# Patient Record
Sex: Male | Born: 1980 | Race: Black or African American | Hispanic: No | Marital: Single | Smoking: Former smoker
Health system: Southern US, Community
[De-identification: ages and names within clinical notes are randomized; demographics above are authoritative.]

## PROBLEM LIST (undated history)

## (undated) HISTORY — PX: WISDOM TOOTH EXTRACTION: SHX21

---

## 2003-02-28 ENCOUNTER — Emergency Department (HOSPITAL_COMMUNITY): Admission: EM | Admit: 2003-02-28 | Discharge: 2003-03-01 | Payer: Self-pay | Admitting: Emergency Medicine

## 2015-06-17 ENCOUNTER — Ambulatory Visit: Payer: Self-pay | Admitting: Urgent Care

## 2015-07-01 ENCOUNTER — Encounter: Payer: Self-pay | Admitting: Urgent Care

## 2015-07-01 ENCOUNTER — Ambulatory Visit (INDEPENDENT_AMBULATORY_CARE_PROVIDER_SITE_OTHER): Payer: 59 | Admitting: Urgent Care

## 2015-07-01 VITALS — BP 125/78 | HR 82 | Temp 98.1°F | Resp 16 | Ht 71.5 in | Wt 186.0 lb

## 2015-07-01 DIAGNOSIS — J302 Other seasonal allergic rhinitis: Secondary | ICD-10-CM

## 2015-07-01 DIAGNOSIS — M2660 Temporomandibular joint disorder, unspecified: Secondary | ICD-10-CM | POA: Diagnosis not present

## 2015-07-01 DIAGNOSIS — M26609 Unspecified temporomandibular joint disorder, unspecified side: Secondary | ICD-10-CM | POA: Insufficient documentation

## 2015-07-01 MED ORDER — FLUTICASONE PROPIONATE 50 MCG/ACT NA SUSP: 2.0000 | Freq: Every day | NASAL | Status: AC

## 2015-07-01 MED ORDER — NAPROXEN SODIUM 550 MG PO TABS: 550.0000 mg | ORAL_TABLET | Freq: Two times a day (BID) | ORAL | Status: AC

## 2015-07-01 MED ORDER — LEVOCETIRIZINE DIHYDROCHLORIDE 5 MG PO TABS: 5.0000 mg | ORAL_TABLET | Freq: Every evening | ORAL | Status: DC

## 2015-07-01 NOTE — Progress Notes (Signed)
    MRN: [id number] DOB: Nov 06, 1981  Subjective:   Caleb Herrera is a 34 y.o. male presenting for chief complaint of Jaw Pain and Allergies  Jaw pain - reports several year history of intermittent right sided jaw pain. Recalls when he was ~19, got hit over his mid-right jaw while horseplaying with a friend. States that he has had difficulty with intermittent jaw pain since then. Reports clicking, popping, difficulty opening his mouth wide at times, cannot always eat burgers d/t difficulty opening mouth wide. Has never tried medications for this. Denies masses, fever, weight loss, numbness and tingling, grinding his teeth, bony deformity.   Allergies - reports that he has had worsening congestion, runny nose, scratchy throat due to developing seasonal allergies since his early 20's. He recently started Zyrtec which has helped some. He has a dog at home, sheds a lot of hair and sleeps on his bed. Denies ear pain, ear drainage, tooth pain, throat pain, shob, wheezing, chest pain. Denies any other aggravating or relieving factors, no other questions or concerns.  Caleb Herrera has a current medication list which includes the following prescription(s): cetirizine. He has No Known Allergies.  Caleb Herrera  has no past medical history on file. Also  has past surgical history that includes Wisdom tooth extraction.  ROS As in subjective.  Objective:   Vitals: BP 125/78 mmHg  Pulse 82  Temp(Src) 98.1 F (36.7 C)  Resp 16  Ht 5' 11.5" (1.816 m)  Wt 186 lb (84.369 kg)  BMI 25.58 kg/m2  Physical Exam  Constitutional: He is oriented to person, place, and time. He appears well-developed and well-nourished.  HENT:  Head:    Patient had difficulty opening jaw past 50-60 degrees with audible popping heard and click felt on right side.  Cardiovascular: Normal rate, regular rhythm and intact distal pulses.  Exam reveals no gallop and no friction rub.   No murmur heard. Pulmonary/Chest: No respiratory  distress. He has no wheezes. He has no rales.  Neurological: He is alert and oriented to person, place, and time.  Skin: Skin is warm and dry. No rash noted. No erythema. No pallor.    Assessment and Plan :   1. 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 Take anaprox, wear mouthguard at night. - If no improvement, consider referral to PT or ortho for further evaluation, consider CT or if any abnormalities show up with tissue then biopsy - Recheck in 2 weeks.  2. Seasonal allergies - Avoid allergens as much as possible including no pets in bedroom. Start allergy meds. Recheck in 2 weeks. - levocetirizine (XYZAL) 5 MG tablet; Take 1 tablet (5 mg total) by mouth every evening.  Dispense: 30 tablet; Refill: 11 - fluticasone (FLONASE) 50 MCG/ACT nasal spray; Place 2 sprays into both nostrils daily.  Dispense: 16 g; Refill: 11   Wallis Bamberg, PA-C [facility] [phone] 07/01/2015 3:52 PM

## 2015-07-01 NOTE — Patient Instructions (Signed)
Temporomandibular Problems  Temporomandibular joint (TMJ) dysfunction means there are problems with the joint between your jaw and your skull. This is a joint lined by cartilage like other joints in your body but also has a small disc in the joint which keeps the bones from rubbing on each other. These joints are like other joints and can get inflamed (sore) from arthritis and other problems. When this joint gets sore, it can cause headaches and pain in the jaw and the face. CAUSES  Usually the arthritic types of problems are caused by soreness in the joint. Soreness in the joint can also be caused by overuse. This may come from grinding your teeth. It may also come from mis-alignment in the joint. DIAGNOSIS Diagnosis of this condition can often be made by history and exam. Sometimes your caregiver may need X-rays or an MRI scan to determine the exact cause. It may be necessary to see your dentist to determine if your teeth and jaws are lined up correctly. TREATMENT  Most of the time this problem is not serious; however, sometimes it can persist (become chronic). When this happens medications that will cut down on inflammation (soreness) help. Sometimes a shot of cortisone into the joint will be helpful. If your teeth are not aligned it may help for your dentist to make a splint for your mouth that can help this problem. If no physical problems can be found, the problem may come from tension. If tension is found to be the cause, biofeedback or relaxation techniques may be helpful. HOME CARE INSTRUCTIONS   Later in the day, applications of ice packs may be helpful. Ice can be used in a plastic bag with a towel around it to prevent frostbite to skin. This may be used about every 2 hours for 20 to 30 minutes, as needed while awake, or as directed by your caregiver.  Only take over-the-counter or prescription medicines for pain, discomfort, or fever as directed by your caregiver.  If physical therapy was  prescribed, follow your caregiver's directions.  Wear mouth appliances as directed if they were given. Document Released: 08/08/2001 Document Revised: 02/05/2012 Document Reviewed: 11/15/2008 ExitCare Patient Information 2015 [employer]. This information is not intended to replace advice given to you by your health care provider. Make sure you discuss any questions you have with your health care provider.  

## 2015-07-15 ENCOUNTER — Encounter: Payer: Self-pay | Admitting: Urgent Care

## 2015-07-15 ENCOUNTER — Ambulatory Visit (INDEPENDENT_AMBULATORY_CARE_PROVIDER_SITE_OTHER): Payer: 59

## 2015-07-15 ENCOUNTER — Ambulatory Visit (INDEPENDENT_AMBULATORY_CARE_PROVIDER_SITE_OTHER): Payer: 59 | Admitting: Urgent Care

## 2015-07-15 VITALS — BP 126/90 | HR 77 | Temp 98.3°F | Resp 16 | Ht 70.5 in | Wt 186.0 lb

## 2015-07-15 DIAGNOSIS — M26609 Unspecified temporomandibular joint disorder, unspecified side: Secondary | ICD-10-CM

## 2015-07-15 DIAGNOSIS — M2660 Temporomandibular joint disorder, unspecified: Secondary | ICD-10-CM

## 2015-07-15 DIAGNOSIS — N6310 Unspecified lump in the right breast, unspecified quadrant: Secondary | ICD-10-CM

## 2015-07-15 DIAGNOSIS — N63 Unspecified lump in breast: Secondary | ICD-10-CM

## 2015-07-15 DIAGNOSIS — R29898 Other symptoms and signs involving the musculoskeletal system: Secondary | ICD-10-CM

## 2015-07-15 NOTE — Progress Notes (Signed)
    MRN: [id number] DOB: 1981-07-01  Subjective:   Caleb Herrera is a 34 y.o. male presenting for follow up on jaw pain and breast mass.   Jaw - he was last seen here by PA-Judah Carchi on 07/01/2015 with working diagnosis of TMJ, started on Anaprox twice daily and advised to wear mouthguard. Patient reports that he has used Anaprox, mouthguard as instructed. He reports significant improvement in pain and ability to open jaw. However, he still has clicking of his jaw. Denies fever, gum pain, numbness or tingling, swelling, tooth pain.   Breast mass - reports that his right breast has been enlarging since his teens. He states that he was told that this was not abnormal and to follow up as needed. Today, he reports a 3 year history of intermittent pain over his right breast elicited with deep pressure, hugs. He also admits significant discomfort with the asymmetry of his chest. ROS as above as well as no nipple discharge or inversion, lactation, bleeding, skin changes, weight loss, night sweats, mood disturbances, acne. Admits family history of cancer in his grandfather but cannot recall what type, currently in remission. Denies any other aggravating or relieving factors, no other questions or concerns.  Maddax has a current medication list which includes the following prescription(s): levocetirizine, naproxen sodium, and fluticasone. Also has No Known Allergies.  Delante  has no past medical history on file. Also  has past surgical history that includes Wisdom tooth extraction.  Objective:   Vitals: BP 126/90 mmHg  Pulse 77  Temp(Src) 98.3 F (36.8 C) (Oral)  Resp 16  Ht 5' 10.5" (1.791 m)  Wt 186 lb (84.369 kg)  BMI 26.30 kg/m2  SpO2 98%  Physical Exam  Constitutional: He is oriented to person, place, and time. He appears well-developed and well-nourished.  HENT:  Mouth/Throat: Mucous membranes are not pale, not dry and not cyanotic. He does not have dentures. No oral lesions. No trismus in  the jaw. Normal dentition. No dental abscesses, uvula swelling, lacerations or dental caries. No oropharyngeal exudate.  TMJ non-tender with a slight click.  Cardiovascular: Normal rate.   Pulmonary/Chest: Effort normal. Right breast exhibits mass (right breast significantly larger than left primarily over area outlined). Right breast exhibits no inverted nipple, no nipple discharge, no skin change and no tenderness. Left breast exhibits no inverted nipple, no mass, no nipple discharge, no skin change and no tenderness. Breasts are asymmetrical.    Neurological: He is alert and oriented to person, place, and time.  Skin: Skin is warm and dry. No rash noted. No erythema. No pallor.  Psychiatric: He has a normal mood and affect.   [facility] reading (PRIMARY) by  Dr. Merla Riches and PA-Creed Kail. Mandible - negative.  Assessment and Plan :   1. TMJ (temporomandibular joint syndrome) 2. Jaw clicking - Physical exam findings reassuring, x-ray reassuring - Recommended patient continue with Anaprox and mouthguard given improvement with these measures - Ambulatory referral to Orthopedic Surgery to further evaluate clicking and options for management/possible injection  3. Breast mass, right - Gynecomastia versus mass, will further evaluate with labs, [place] of right breast - F/u s/p [place] - US BREAST COMPLETE UNI RIGHT INC AXILLA; Future - Testosterone - Prolactin - Comprehensive metabolic panel - TSH  Wallis Bamberg, PA-C [facility] [phone] 07/15/2015 2:50 PM

## 2015-07-15 NOTE — Patient Instructions (Signed)
Temporomandibular Problems  Temporomandibular joint (TMJ) dysfunction means there are problems with the joint between your jaw and your skull. This is a joint lined by cartilage like other joints in your body but also has a small disc in the joint which keeps the bones from rubbing on each other. These joints are like other joints and can get inflamed (sore) from arthritis and other problems. When this joint gets sore, it can cause headaches and pain in the jaw and the face. CAUSES  Usually the arthritic types of problems are caused by soreness in the joint. Soreness in the joint can also be caused by overuse. This may come from grinding your teeth. It may also come from mis-alignment in the joint. DIAGNOSIS Diagnosis of this condition can often be made by history and exam. Sometimes your caregiver may need X-rays or an MRI scan to determine the exact cause. It may be necessary to see your dentist to determine if your teeth and jaws are lined up correctly. TREATMENT  Most of the time this problem is not serious; however, sometimes it can persist (become chronic). When this happens medications that will cut down on inflammation (soreness) help. Sometimes a shot of cortisone into the joint will be helpful. If your teeth are not aligned it may help for your dentist to make a splint for your mouth that can help this problem. If no physical problems can be found, the problem may come from tension. If tension is found to be the cause, biofeedback or relaxation techniques may be helpful. HOME CARE INSTRUCTIONS   Later in the day, applications of ice packs may be helpful. Ice can be used in a plastic bag with a towel around it to prevent frostbite to skin. This may be used about every 2 hours for 20 to 30 minutes, as needed while awake, or as directed by your caregiver.  Only take over-the-counter or prescription medicines for pain, discomfort, or fever as directed by your caregiver.  If physical therapy was  prescribed, follow your caregiver's directions.  Wear mouth appliances as directed if they were given. Document Released: 08/08/2001 Document Revised: 02/05/2012 Document Reviewed: 11/15/2008 ExitCare Patient Information 2015 [employer]. This information is not intended to replace advice given to you by your health care provider. Make sure you discuss any questions you have with your health care provider.  

## 2015-07-16 LAB — COMPREHENSIVE METABOLIC PANEL
ALBUMIN: 4.8 g/dL (ref 3.6–5.1)
ALT: 19 U/L (ref 9–46)
AST: 16 U/L (ref 10–40)
Alkaline Phosphatase: 36 U/L — ABNORMAL LOW (ref 40–115)
BUN: 18 mg/dL (ref 7–25)
CALCIUM: 10.2 mg/dL (ref 8.6–10.3)
CO2: 29 mmol/L (ref 20–31)
Chloride: 102 mmol/L (ref 98–110)
Creat: 0.79 mg/dL (ref 0.60–1.35)
Glucose, Bld: 86 mg/dL (ref 65–99)
Potassium: 5 mmol/L (ref 3.5–5.3)
Sodium: 140 mmol/L (ref 135–146)
TOTAL PROTEIN: 7.2 g/dL (ref 6.1–8.1)
Total Bilirubin: 0.4 mg/dL (ref 0.2–1.2)

## 2015-07-16 LAB — PROLACTIN: Prolactin: 7.4 ng/mL (ref 2.1–17.1)

## 2015-07-16 LAB — TESTOSTERONE: TESTOSTERONE: 506 ng/dL (ref 300–890)

## 2015-07-16 LAB — TSH: TSH: 2.187 u[IU]/mL (ref 0.350–4.500)

## 2015-07-18 ENCOUNTER — Encounter: Payer: Self-pay | Admitting: Family Medicine

## 2015-07-22 ENCOUNTER — Telehealth: Payer: Self-pay | Admitting: Urgent Care

## 2015-07-22 NOTE — Telephone Encounter (Signed)
Called patient to see if he wanted to pursue referral to oral surgeon. Ortho was contacted and believe that he would be better managed for TMJ with an oral surgeon. However, from his previous visit, patient did not want to be too aggressive with his TMJ. Please let me know how he'd like to proceed. Also, please let me know if his U/S has been scheduled when patient calls back.  Thank you!

## 2015-07-26 ENCOUNTER — Other Ambulatory Visit: Payer: Self-pay | Admitting: Urgent Care

## 2015-07-26 DIAGNOSIS — N6310 Unspecified lump in the right breast, unspecified quadrant: Secondary | ICD-10-CM

## 2015-07-26 NOTE — Telephone Encounter (Signed)
Left a detailed message letting pt know to call back if he wants referral.

## 2015-07-26 NOTE — Telephone Encounter (Signed)
Reviewed results. Patient declined referral to oral surgery. He is awaiting appt for mammogram and [place] chest.

## 2015-07-28 ENCOUNTER — Telehealth: Payer: Self-pay

## 2015-07-28 DIAGNOSIS — N6310 Unspecified lump in the right breast, unspecified quadrant: Secondary | ICD-10-CM

## 2015-07-28 NOTE — Telephone Encounter (Signed)
[facility] requesting the location of the mass on patients right breast. Please add it to the order for [facility] Digital Diagnostic Bilateral. Once the location is added to the current order [facility] imaging will contact patient to schedule.

## 2015-07-29 NOTE — Telephone Encounter (Signed)
Information added. Thank you Crystal!

## 2015-08-02 ENCOUNTER — Other Ambulatory Visit: Payer: Self-pay

## 2015-08-10 ENCOUNTER — Ambulatory Visit
Admission: RE | Admit: 2015-08-10 | Discharge: 2015-08-10 | Disposition: A | Discharge status: Home / Self Care | Payer: 59 | Source: Ambulatory Visit | Attending: Urgent Care | Admitting: Urgent Care

## 2015-08-10 DIAGNOSIS — N6310 Unspecified lump in the right breast, unspecified quadrant: Secondary | ICD-10-CM

## 2015-08-31 ENCOUNTER — Other Ambulatory Visit: Payer: Self-pay | Admitting: Surgery

## 2016-01-05 ENCOUNTER — Other Ambulatory Visit: Payer: Self-pay | Admitting: Urgent Care

## 2016-01-05 MED FILL — LEVOCETIRIZINE 5 MG TABLET: 5 | 30 days supply | Qty: 30 | Fill #3

## 2016-06-09 MED FILL — LEVOCETIRIZINE 5 MG TABLET: 5 | 30 days supply | Qty: 30 | Fill #4

## 2016-07-12 IMAGING — CR DG MANDIBLE 4+V
4 series · 4 of 4 positions shown · non-contrast
Comparison: None in PACs

CLINICAL DATA: Right temporomandibular joint clicking

EXAM:
MANDIBLE - 4+ VIEW

[oblique (1 of 2)]
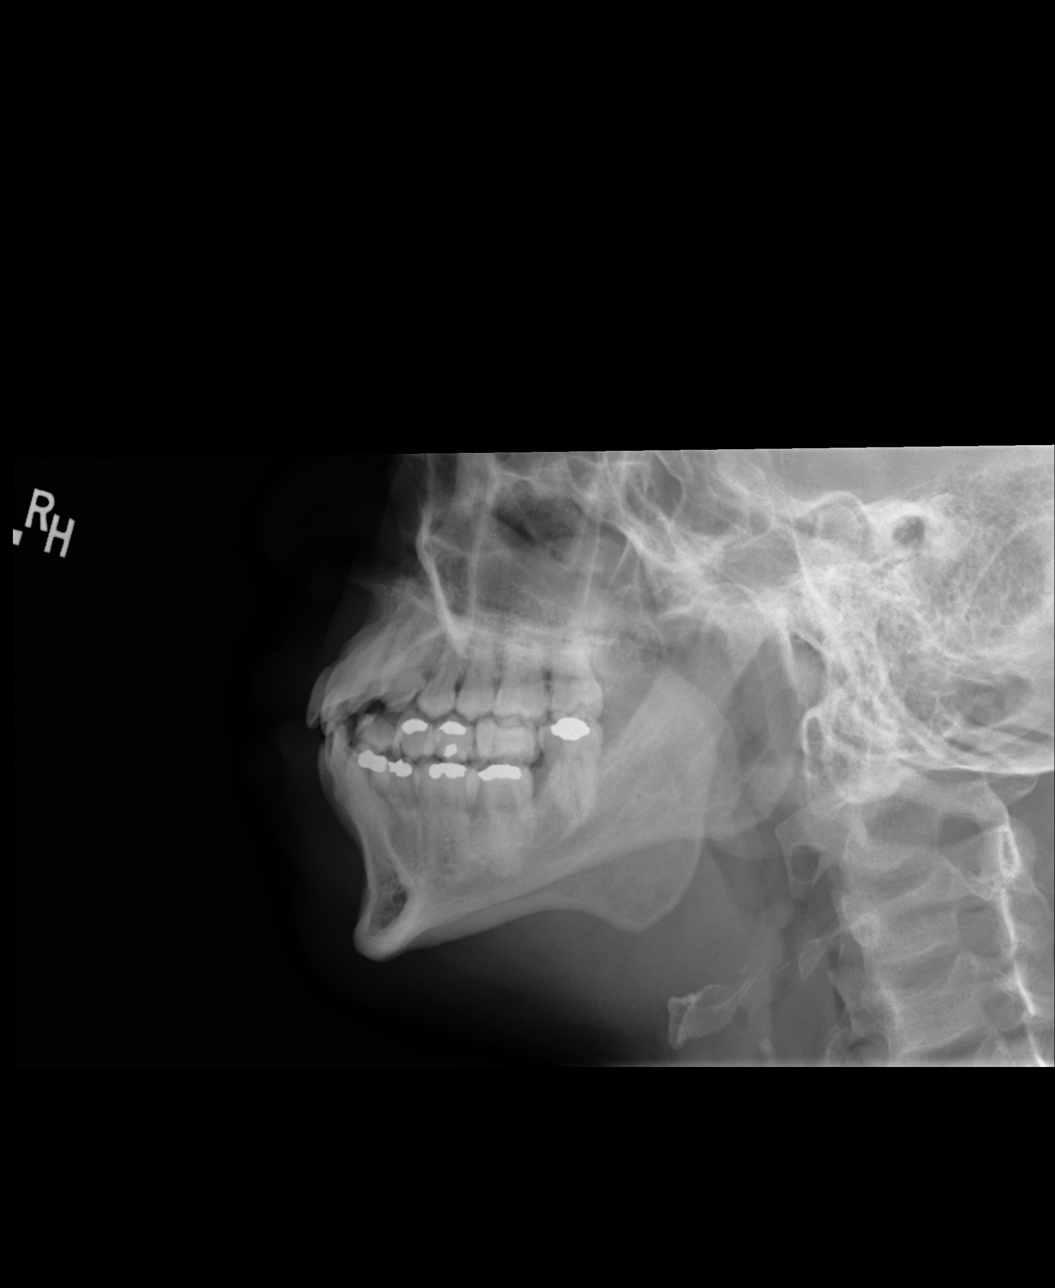

[PA]
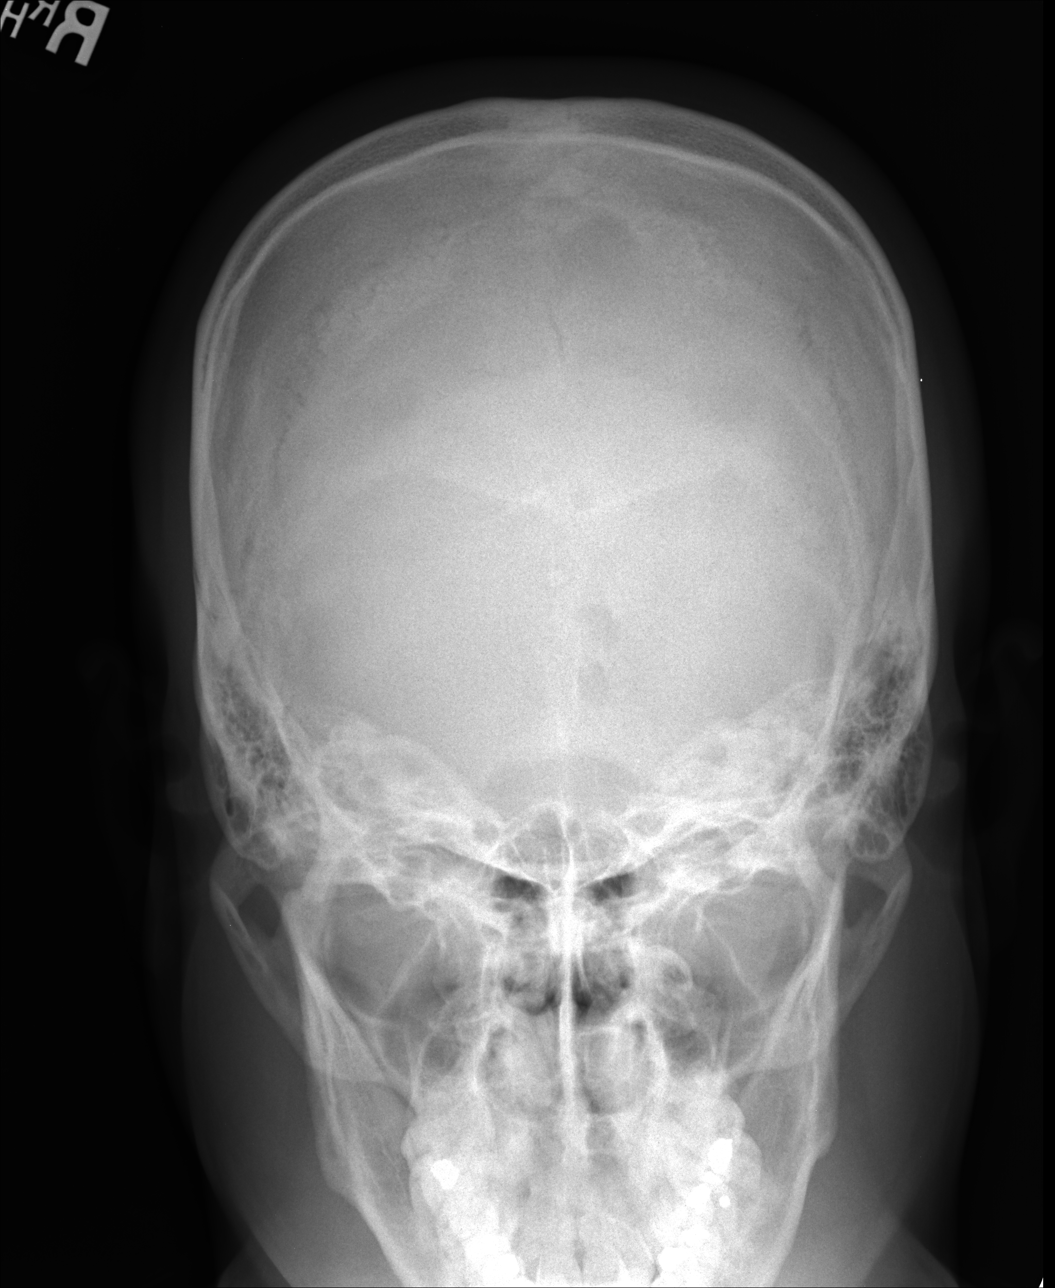

[lateral]
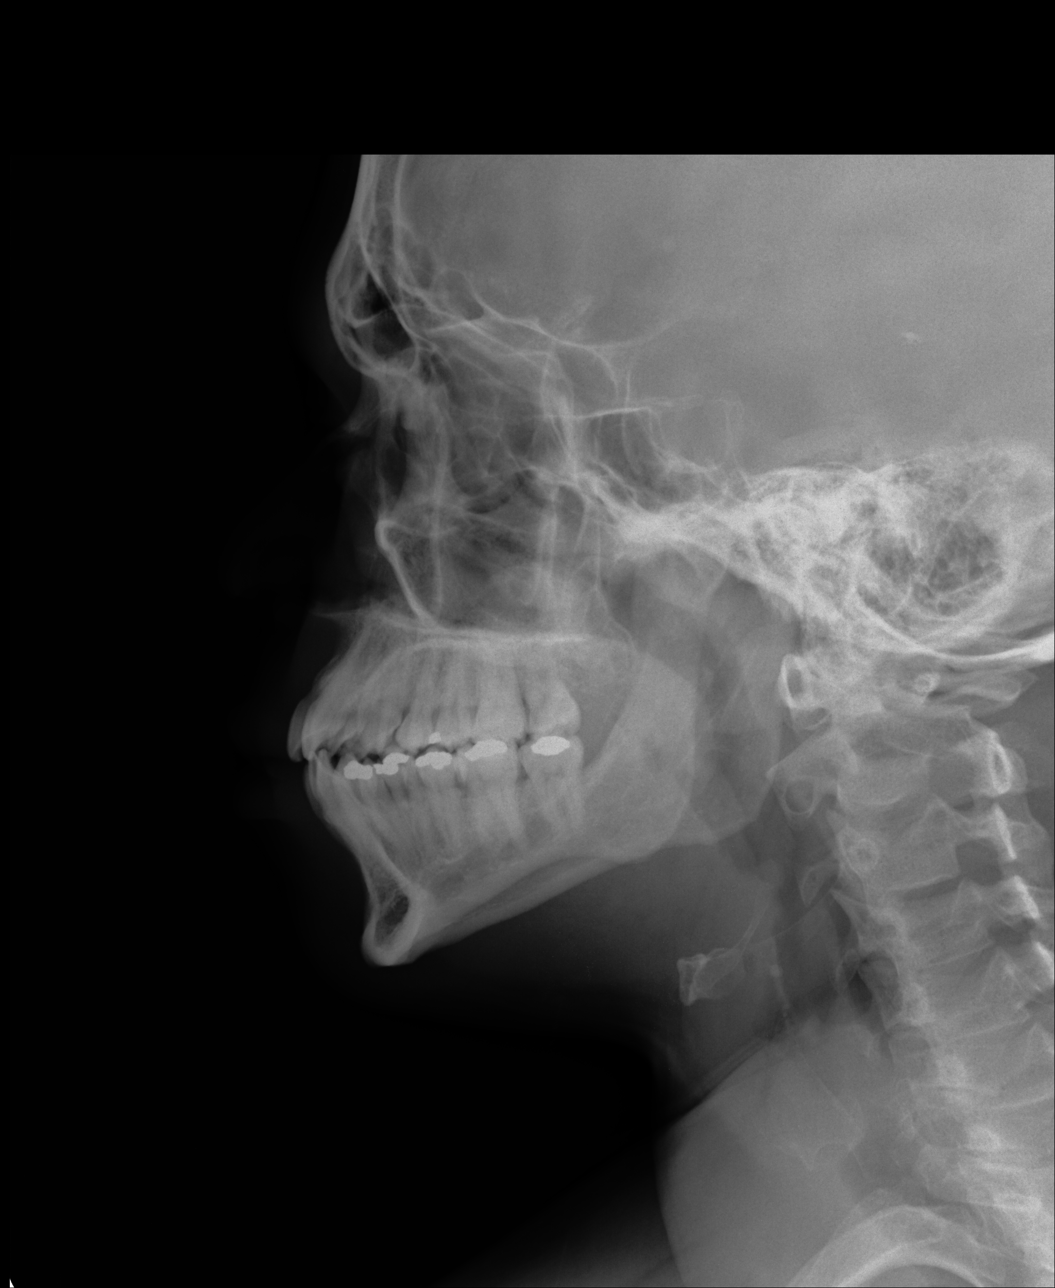

[oblique (2 of 2)]
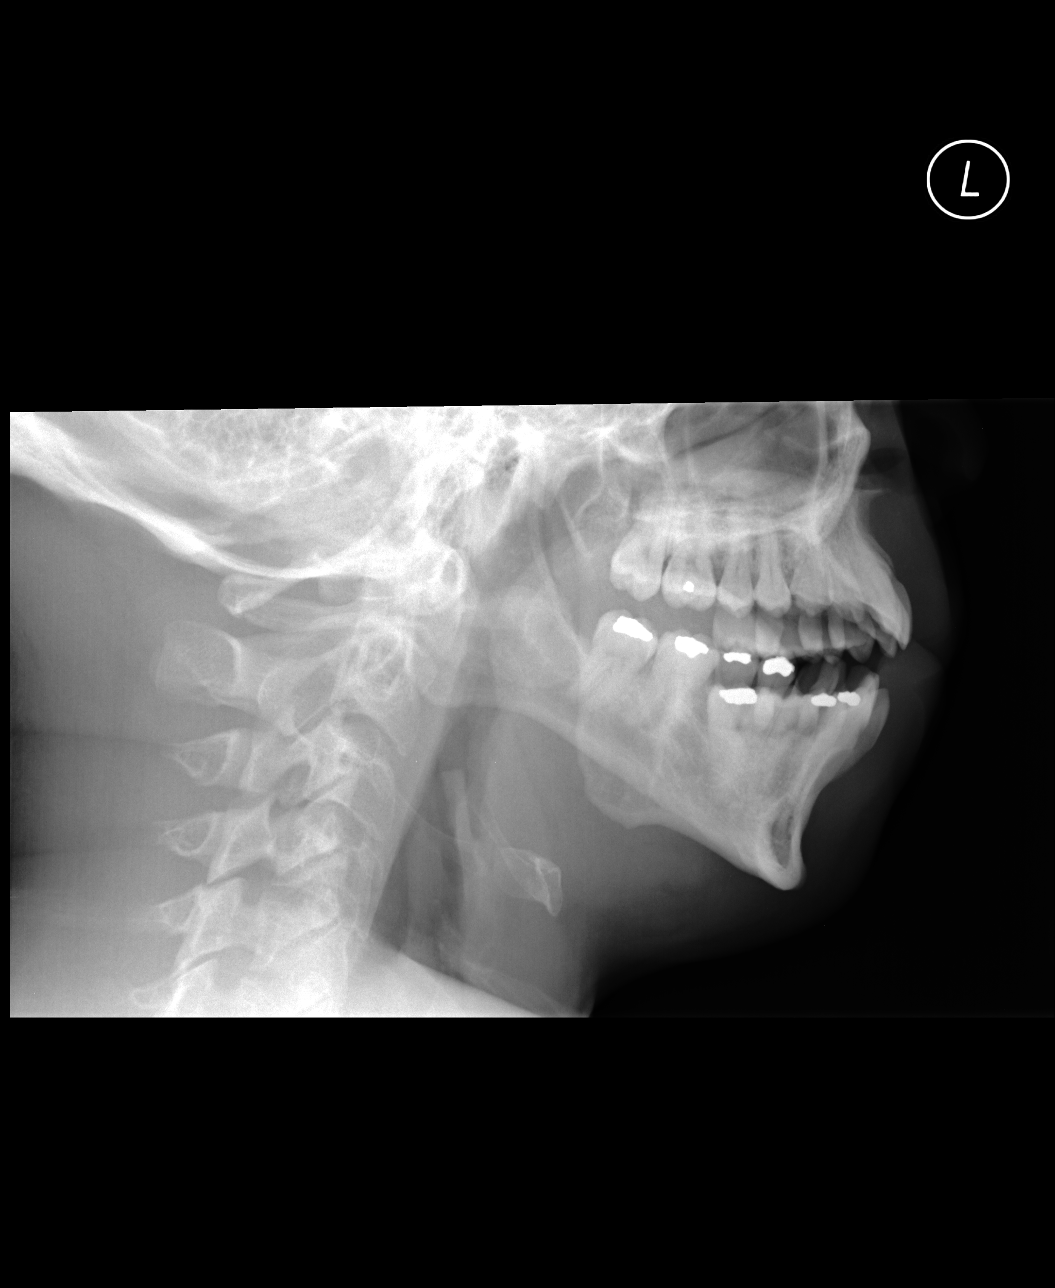

[4 of 4 positions shown; findings below may reference images not displayed]

FINDINGS: Visualization of the temporomandibular joints is limited
bilaterally. Where visualized no definite acute bony abnormality is
observed.
IMPRESSION: Limited visualization of the temporomandibular joints on this plain
radiographic series. Further evaluation with CT scanning or MRI
would be useful for better anatomic evaluation.

## 2016-07-13 ENCOUNTER — Other Ambulatory Visit: Payer: Self-pay | Admitting: Urgent Care

## 2016-07-13 DIAGNOSIS — J302 Other seasonal allergic rhinitis: Secondary | ICD-10-CM

## 2016-11-01 MED FILL — LEVOCETIRIZINE 5 MG TABLET: 5 | 30 days supply | Qty: 30 | Fill #0

## 2017-03-08 ENCOUNTER — Emergency Department (HOSPITAL_COMMUNITY): Payer: 59

## 2017-03-08 ENCOUNTER — Emergency Department (HOSPITAL_COMMUNITY)
Admission: EM | Admit: 2017-03-08 | Discharge: 2017-03-08 | Disposition: A | Discharge status: Home / Self Care | Payer: 59 | Attending: Emergency Medicine | Admitting: Emergency Medicine

## 2017-03-08 ENCOUNTER — Encounter (HOSPITAL_COMMUNITY): Payer: Self-pay

## 2017-03-08 DIAGNOSIS — W268XXA Contact with other sharp object(s), not elsewhere classified, initial encounter: Secondary | ICD-10-CM | POA: Diagnosis not present

## 2017-03-08 DIAGNOSIS — Y999 Unspecified external cause status: Secondary | ICD-10-CM | POA: Diagnosis not present

## 2017-03-08 DIAGNOSIS — Y929 Unspecified place or not applicable: Secondary | ICD-10-CM | POA: Diagnosis not present

## 2017-03-08 DIAGNOSIS — F17200 Nicotine dependence, unspecified, uncomplicated: Secondary | ICD-10-CM | POA: Insufficient documentation

## 2017-03-08 DIAGNOSIS — Y939 Activity, unspecified: Secondary | ICD-10-CM | POA: Diagnosis not present

## 2017-03-08 DIAGNOSIS — S61011A Laceration without foreign body of right thumb without damage to nail, initial encounter: Secondary | ICD-10-CM | POA: Diagnosis not present

## 2017-03-08 DIAGNOSIS — Z23 Encounter for immunization: Secondary | ICD-10-CM | POA: Insufficient documentation

## 2017-03-08 MED ORDER — TETANUS-DIPHTH-ACELL PERTUSSIS 5-2.5-18.5 LF-MCG/0.5 IM SUSP
0.5000 mL | Freq: Once | INTRAMUSCULAR | Status: AC
  Administered 2017-03-08: 0.5 mL via INTRAMUSCULAR
  Filled 2017-03-08: qty 0.5

## 2017-03-08 MED ORDER — IBUPROFEN 800 MG PO TABS
800.0000 mg | ORAL_TABLET | Freq: Three times a day (TID) | ORAL | Status: DC
  Administered 2017-03-08: 800 mg via ORAL
  Filled 2017-03-08: qty 1

## 2017-03-08 NOTE — Discharge Instructions (Signed)
Your have an avulsion laceration to the thumb. There was no way to repair the wound with sutures so we have used medication to seal the wound and applied pressure to try and stop the bleeding. Follow up in 2 days for wound check or sooner for any problems.

## 2017-03-08 NOTE — ED Notes (Signed)
See PA assessment note. 

## 2017-03-08 NOTE — ED Provider Notes (Signed)
[facility]-EMERGENCY DEPT Provider Note   CSN: [id number] Arrival date & time: 03/08/17  1801   By signing my name below, I, Clarisse Gouge, attest that this documentation has been prepared under the direction and in the presence of [facility], FNP. Electronically Signed: Clarisse Gouge, Scribe. 03/08/17. 6:50 PM.   History   Chief Complaint Chief Complaint  Patient presents with  . Finger Injury   The history is provided by the patient and medical records. No language interpreter was used.    Caleb Herrera is a 36 y.o. male with no h/o anticoagulant use, self transported via private vehicle to the Emergency Department with concern for a R thumb laceration that he sustained PTA. He states he detached the tip of the R thumb with an apple slicer. Moderate bleeding, tingling and numbness that has subsided since partially removing dressing in [facility] ED noted. Dressing partially applied on exam. Last tetanus unknown.  History reviewed. No pertinent past medical history.  Patient Active Problem List   Diagnosis Date Noted  . Jaw clicking 07/15/2015  . TMJ (temporomandibular joint syndrome) 07/01/2015  . Seasonal allergies 07/01/2015    Past Surgical History:  Procedure Laterality Date  . WISDOM TOOTH EXTRACTION         Home Medications    Prior to Admission medications   Medication Sig Start Date End Date Taking? Authorizing Provider  fluticasone (FLONASE) 50 MCG/ACT nasal spray Place 2 sprays into both nostrils daily. Patient not taking: Reported on 07/15/2015 07/01/15   Wallis Bamberg, PA-C  levocetirizine (XYZAL) 5 MG tablet TAKE 1 TABLET BY MOUTH EVERY EVENING. 07/13/16   Wallis Bamberg, PA-C  naproxen sodium (ANAPROX) 550 MG tablet Take 1 tablet (550 mg total) by mouth 2 (two) times daily with a meal. 07/01/15   Wallis Bamberg, PA-C    Family History History reviewed. No pertinent family history.  Social History Social History  Substance Use Topics  . Smoking status: Current Every Day  Smoker  . Smokeless tobacco: Never Used  . Alcohol use 0.0 oz/week     Allergies   Patient has no known allergies.   Review of Systems Review of Systems  Musculoskeletal: Positive for arthralgias.  Skin: Positive for wound.  Neurological: Positive for numbness (at tip of finger). Negative for dizziness and weakness.     Physical Exam Updated Vital Signs BP (!) 148/107 (BP Location: Left Arm)   Pulse (!) 101   Temp 98.5 F (36.9 C) (Oral)   Resp 17   Ht  (1.803 m)   Wt 190 lb (86.2 kg)   SpO2 97%   BMI 26.50 kg/m   Physical Exam  Constitutional: He is oriented to person, place, and time. He appears well-developed and well-nourished.  HENT:  Head: Normocephalic and atraumatic.  Eyes: Conjunctivae are normal.  Neck: Neck supple.  Cardiovascular: Tachycardia present.   Pulmonary/Chest: Effort normal.  Musculoskeletal:       Right hand: He exhibits tenderness and laceration. He exhibits normal range of motion and normal capillary refill. Normal sensation noted. Normal strength noted.       Hands: Avulsion laceration to the tip of the right thumb.   Neurological: He is alert and oriented to person, place, and time. Coordination normal.  Skin: Skin is warm and dry.  Fingertip laceration  Psychiatric: He has a normal mood and affect. His behavior is normal.  Nursing note and vitals reviewed.    ED Treatments / Results  DIAGNOSTIC STUDIES: Oxygen Saturation is  97% on RA, normal by my interpretation.    COORDINATION OF CARE: 6:47 PM Discussed treatment plan with pt at bedside and pt agreed to plan. Will order imaging and tetanus.  Dr. Silverio Lay in to see the patient. He cleaned the wound and applied surgi seal then  Gelfoam and dressing. Splint applied over dressing. Patient observed for an additional 30 minutes to be sure the bleeding did not start back.  Patient stable for d/c without further bleeding and is neurovascularly intact.    Labs (all labs ordered are  listed, but only abnormal results are displayed) Labs Reviewed - No data to display  Radiology Dg Finger Thumb Right  Result Date: 03/08/2017 CLINICAL DATA:  Thumb laceration while cutting fruit. EXAM: RIGHT THUMB 2+V COMPARISON:  None. FINDINGS: Bandage overlies the first digit without subcutaneous gas. Laceration posterior tuft soft tissues. No fracture deformity. No dislocation. No destructive bony lesions. IMPRESSION: First digit tuft soft tissue laceration without acute osseous process. Electronically Signed   By: Awilda Metro M.D.   On: 03/08/2017 19:29    Procedures Procedures (including critical care time)  Medications Ordered in ED Medications  Tdap (BOOSTRIX) injection 0.5 mL (0.5 mLs Intramuscular Given 03/08/17 2111)     Initial Impression / Assessment and Plan / ED Course  I have reviewed the triage vital signs and the nursing notes.  Pertinent  imaging results that were available during my care of the patient were reviewed by me and considered in my medical decision making (see chart for details).    Tetanus updated in ED.Marland Kitchen Laceration occurred < 12 hours prior to repair. Discussed laceration care with pt and answered questions. Pt to f-u for for wound check in 2 days.or sooner should there be any problems. Pt is hemodynamically stable with no complaints prior to dc.      Final Clinical Impressions(s) / ED Diagnoses  Avulsion laceration to the tip of the right thumb stable for d/c with bleeding controlled and no focal neuro deficit. Discussed with the patient and all questioned fully answered. New Prescriptions Discharge Medication List as of 03/08/2017 10:06 PM    I personally performed the services described in this documentation, which was scribed in my presence. The recorded information has been reviewed and is accurate.    [place], NP 03/09/17 0315    Charlynne Pander, MD 03/12/17 [id number]

## 2017-03-08 NOTE — ED Triage Notes (Addendum)
Pt was slicing an apple and sliced his right thumb. Part of the corner of the thumb is completely detached. Moderate amount of bleeding through gauze dressing.

## 2017-03-08 NOTE — ED Notes (Signed)
Patient transported to X-ray 

## 2017-06-15 ENCOUNTER — Encounter: Payer: Self-pay | Admitting: Physician Assistant

## 2017-06-15 ENCOUNTER — Ambulatory Visit (INDEPENDENT_AMBULATORY_CARE_PROVIDER_SITE_OTHER): Payer: 59 | Admitting: Physician Assistant

## 2017-06-15 VITALS — BP 153/81 | HR 117 | Temp 99.8°F | Resp 16 | Ht 70.0 in | Wt 180.0 lb

## 2017-06-15 DIAGNOSIS — J029 Acute pharyngitis, unspecified: Secondary | ICD-10-CM

## 2017-06-15 DIAGNOSIS — R509 Fever, unspecified: Secondary | ICD-10-CM | POA: Diagnosis not present

## 2017-06-15 LAB — POCT CBC
GRANULOCYTE PERCENT: 76.4 % (ref 37–80)
HEMATOCRIT: 44.9 % (ref 43.5–53.7)
HEMOGLOBIN: 14.6 g/dL (ref 14.1–18.1)
LYMPH, POC: 1.9 (ref 0.6–3.4)
MCH, POC: 26.2 pg — AB (ref 27–31.2)
MCHC: 32.5 g/dL (ref 31.8–35.4)
MCV: 80.7 fL (ref 80–97)
MID (cbc): 0.7 (ref 0–0.9)
MPV: 6.6 fL (ref 0–99.8)
PLATELET COUNT, POC: 363 10*3/uL (ref 142–424)
POC GRANULOCYTE: 8.2 — AB (ref 2–6.9)
POC LYMPH PERCENT: 17.5 %L (ref 10–50)
POC MID %: 6.1 %M (ref 0–12)
RBC: 5.56 M/uL (ref 4.69–6.13)
RDW, POC: 12.9 %
WBC: 10.7 10*3/uL — AB (ref 4.6–10.2)

## 2017-06-15 LAB — POCT RAPID STREP A (OFFICE): Rapid Strep A Screen: NEGATIVE

## 2017-06-15 MED ORDER — AMOXICILLIN 500 MG PO CAPS: 500.0000 mg | ORAL_CAPSULE | Freq: Two times a day (BID) | ORAL | 0 refills | Status: AC

## 2017-06-15 MED ORDER — ACETAMINOPHEN 500 MG PO TABS
1000.0000 mg | ORAL_TABLET | Freq: Once | ORAL | Status: AC
Start: 2017-06-15 — End: 2017-06-15
  Administered 2017-06-15: 1000 mg via ORAL

## 2017-06-15 MED FILL — AMOXICILLIN 500 MG CAPSULE: 500 | 10 days supply | Qty: 20 | Fill #0

## 2017-06-15 NOTE — Progress Notes (Signed)
MRN: [id number] DOB: Dec 30, 1980  Subjective:   Caleb Herrera is a 36 y.o. male presenting for chief complaint of chills (x 4 days); Sore Throat ("feels like a knife cutting"); and Fever .  Reports 5 day history of fever, chills, nasal congestion, postnasal drip, and sore throat. Has mild dry cough. Has tried tylenol cold and flu and tea with no relief. Cannot drink water because it hurts so bad but he can swallow. Denies myalgias, ear pain, wheezing, shortness of breath and chest pain, fatigue, nausea, vomiting, abdominal pain and diarrhea. Has had sick contact with family members. His son had the exact same symptoms, his strep came back negative but his doctor treated him with antibiotics and he felt better.  Has history of seasonal allergies, takes flonase occasionally. No history of asthma. No current smoking.  Denies any other aggravating or relieving factors, no other questions or concerns.  Caleb Herrera has a current medication list which includes the following prescription(s): fluticasone, levocetirizine, and naproxen sodium. Also has No Known Allergies.  Caleb Herrera  has no past medical history on file. Also  has a past surgical history that includes Wisdom tooth extraction.   Objective:   Vitals: BP (!) 153/81   Pulse (!) 117   Temp 99.8 F (37.7 C)   Resp 16   Ht 5\' 10"  (1.778 m)   Wt 180 lb (81.6 kg)   SpO2 98%   BMI 25.83 kg/m   Physical Exam  Constitutional: He is oriented to person, place, and time. He appears well-developed and well-nourished.  HENT:  Head: Normocephalic and atraumatic.  Right Ear: External ear and ear canal normal. There is drainage (blocking view of TM).  Left Ear: Tympanic membrane, external ear and ear canal normal.  Nose: Mucosal edema (moderate bilaterally) present. Right sinus exhibits no maxillary sinus tenderness and no frontal sinus tenderness. Left sinus exhibits no maxillary sinus tenderness and no frontal sinus tenderness.  Mouth/Throat:  Uvula is midline and mucous membranes are normal. Uvula swelling (mild) present. Posterior oropharyngeal erythema present. Tonsils are 2+ on the right. Tonsils are 2+ on the left. Tonsillar exudate (one noted on left tonsil).  Eyes: Conjunctivae are normal.  Neck: Normal range of motion.  Cardiovascular: Regular rhythm and normal heart sounds.  Tachycardia present.   Pulmonary/Chest: Effort normal. He has no wheezes. He has no rhonchi. He has no rales.  Lymphadenopathy:       Head (right side): No submental, no submandibular, no tonsillar, no preauricular, no posterior auricular and no occipital adenopathy present.       Head (left side): No submental, no submandibular, no tonsillar, no preauricular, no posterior auricular and no occipital adenopathy present.    He has cervical adenopathy.       Right cervical: Superficial cervical adenopathy present. No deep cervical and no posterior cervical adenopathy present.      Left cervical: Superficial cervical adenopathy present. No deep cervical and no posterior cervical adenopathy present.    He has no axillary adenopathy.  Neurological: He is alert and oriented to person, place, and time.  Skin: Skin is warm and dry.  Psychiatric: He has a normal mood and affect.  Vitals reviewed.   Results for orders placed or performed in visit on 06/15/17 (from the past 24 hour(s))  POCT rapid strep A     Status: None   Collection Time: 06/15/17  3:26 PM  Result Value Ref Range   Rapid Strep A Screen Negative Negative  POCT CBC  Status: Abnormal   Collection Time: 06/15/17  3:52 PM  Result Value Ref Range   WBC 10.7 (A) 4.6 - 10.2 K/uL   Lymph, poc 1.9 0.6 - 3.4   POC LYMPH PERCENT 17.5 10 - 50 %L   MID (cbc) 0.7 0 - 0.9   POC MID % 6.1 0 - 12 %M   POC Granulocyte 8.2 (A) 2 - 6.9   Granulocyte percent 76.4 37 - 80 %G   RBC 5.56 4.69 - 6.13 M/uL   Hemoglobin 14.6 14.1 - 18.1 g/dL   HCT, POC 16.144.9 09.643.5 - 53.7 %   MCV 80.7 80 - 97 fL   MCH, POC  26.2 (A) 27 - 31.2 pg   MCHC 32.5 31.8 - 35.4 g/dL   RDW, POC 04.512.9 %   Platelet Count, POC 363 142 - 424 K/uL   MPV 6.6 0 - 99.8 fL    Assessment and Plan :  1. Sore throat Culture pending. Rapid strep negative, culture pending.  Due to duration and centor score of 3, will treat empirically for strep with antibiotics at this time. Pt encouraged to return to clinic if symptoms worsen, do not improve in 24-48 hours, or as needed - POCT rapid strep A - Culture, Group A Strep - POCT CBC - amoxicillin (AMOXIL) 500 MG capsule; Take 1 capsule (500 mg total) by mouth 2 (two) times daily.  Dispense: 20 capsule; Refill: 0  2. Fever, unspecified Resolved in office after tylenol was given to pt.  - acetaminophen (TYLENOL) tablet 1,000 mg; Take 2 tablets (1,000 mg total) by mouth once.  Benjiman CoreBrittany Advith Martine, PA-C  [facility] 06/17/2017 9:16 PM

## 2017-06-15 NOTE — Patient Instructions (Addendum)
Your rapid strep test was negative, but we are going to treat empirically for strep throat with amoxicillin. Please take antibiotic as prescribed. We have sent off a strep culture and will contact you with the results. Return to clinic if symptoms worsen, do not improve in 24-48 hours, or as needed   Pharyngitis Pharyngitis is a sore throat (pharynx). There is redness, pain, and swelling of your throat. Follow these instructions at home:  Drink enough fluids to keep your pee (urine) clear or pale yellow.  Only take medicine as told by your doctor. ? You may get sick again if you do not take medicine as told. Finish your medicines, even if you start to feel better. ? Do not take aspirin.  Rest.  Rinse your mouth (gargle) with salt water ( tsp of salt per 1 qt of water) every 1-2 hours. This will help the pain.  If you are not at risk for choking, you can suck on hard candy or sore throat lozenges. Contact a doctor if:  You have large, tender lumps on your neck.  You have a rash.  You cough up green, yellow-brown, or bloody spit. Get help right away if:  You have a stiff neck.  You drool or cannot swallow liquids.  You throw up (vomit) or are not able to keep medicine or liquids down.  You have very bad pain that does not go away with medicine.  You have problems breathing (not from a stuffy nose). This information is not intended to replace advice given to you by your health care provider. Make sure you discuss any questions you have with your health care provider. Document Released: 05/01/2008 Document Revised: 04/20/2016 Document Reviewed: 07/21/2013 Elsevier Interactive Patient Education  2017 [employer]   IF you received an x-ray today, you will receive an invoice from [facility]. Please contact [facility] at [phone] with questions or concerns regarding your invoice.   IF you received labwork today, you will receive an invoice from [facility].  Please contact [facility] at [phone] with questions or concerns regarding your invoice.   Our billing staff will not be able to assist you with questions regarding bills from these companies.  You will be contacted with the lab results as soon as they are available. The fastest way to get your results is to activate your My Chart account. Instructions are located on the last page of this paperwork. If you have not heard from us regarding the results in 2 weeks, please contact this office.

## 2017-06-18 LAB — CULTURE, GROUP A STREP

## 2017-12-18 DIAGNOSIS — L26 Exfoliative dermatitis: Secondary | ICD-10-CM | POA: Diagnosis not present

## 2017-12-18 DIAGNOSIS — F331 Major depressive disorder, recurrent, moderate: Secondary | ICD-10-CM | POA: Diagnosis not present

## 2018-02-27 DIAGNOSIS — L218 Other seborrheic dermatitis: Secondary | ICD-10-CM | POA: Diagnosis not present

## 2018-02-27 MED FILL — HYDROCORTISONE 2.5% CREAM: 2.5 | 30 days supply | Qty: 30 | Fill #0

## 2018-02-27 MED FILL — KETOCONAZOLE 2% CREAM: 2 | 30 days supply | Qty: 30 | Fill #0

## 2018-03-06 IMAGING — DX DG FINGER THUMB 2+V*R*
3 series · 3 of 3 positions shown · non-contrast
Comparison: None.

CLINICAL DATA: Thumb laceration while cutting fruit.

EXAM:
RIGHT THUMB 2+V

[finger ap]
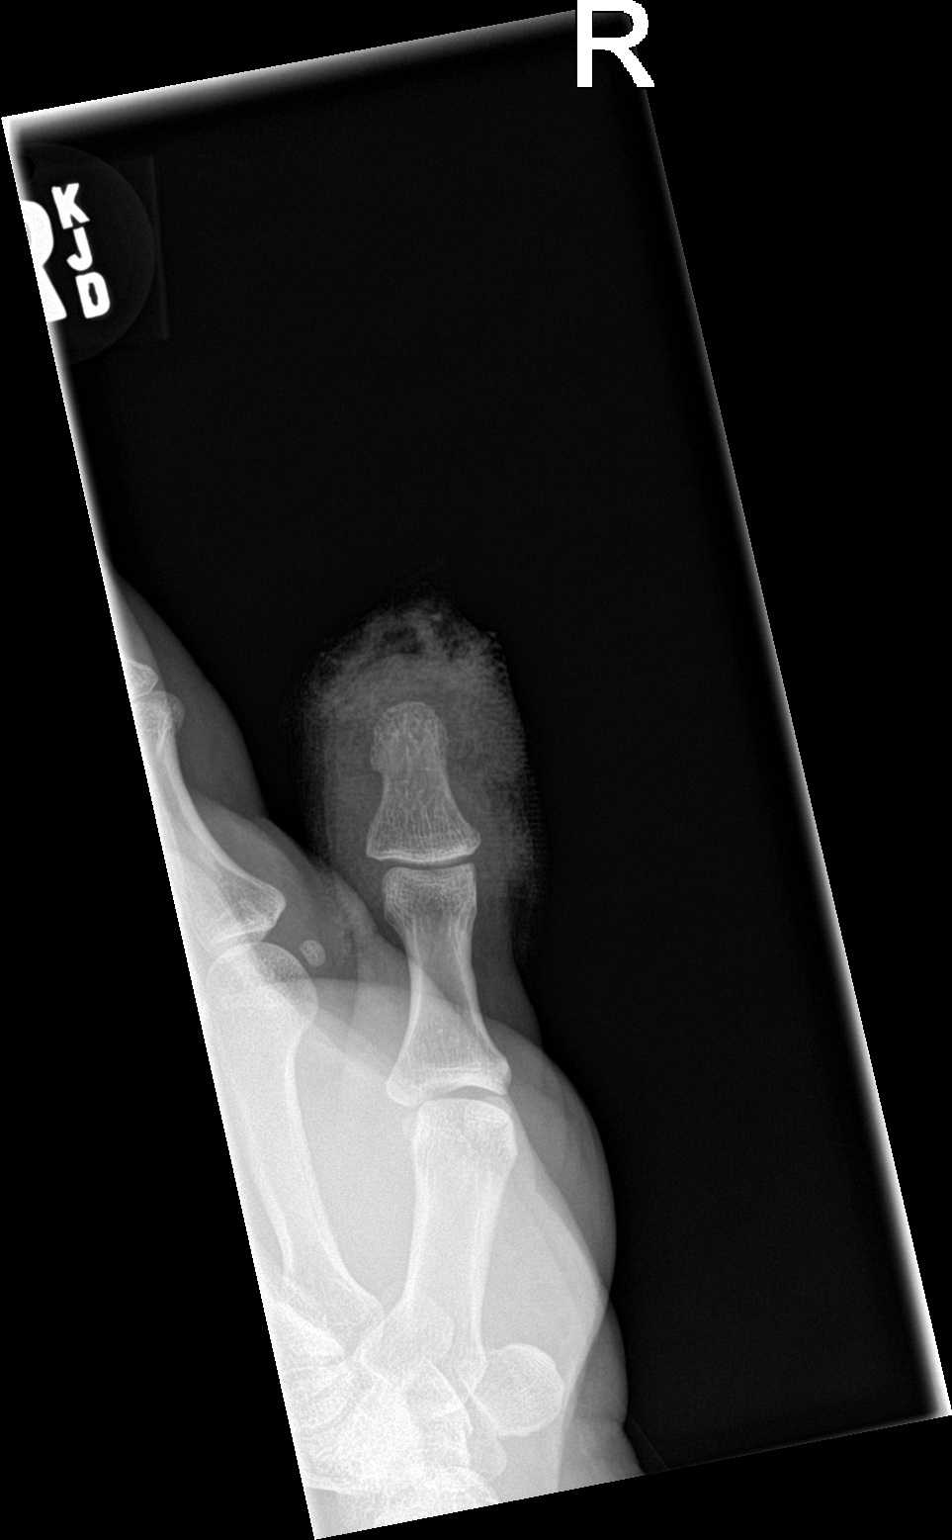

[finger obl]
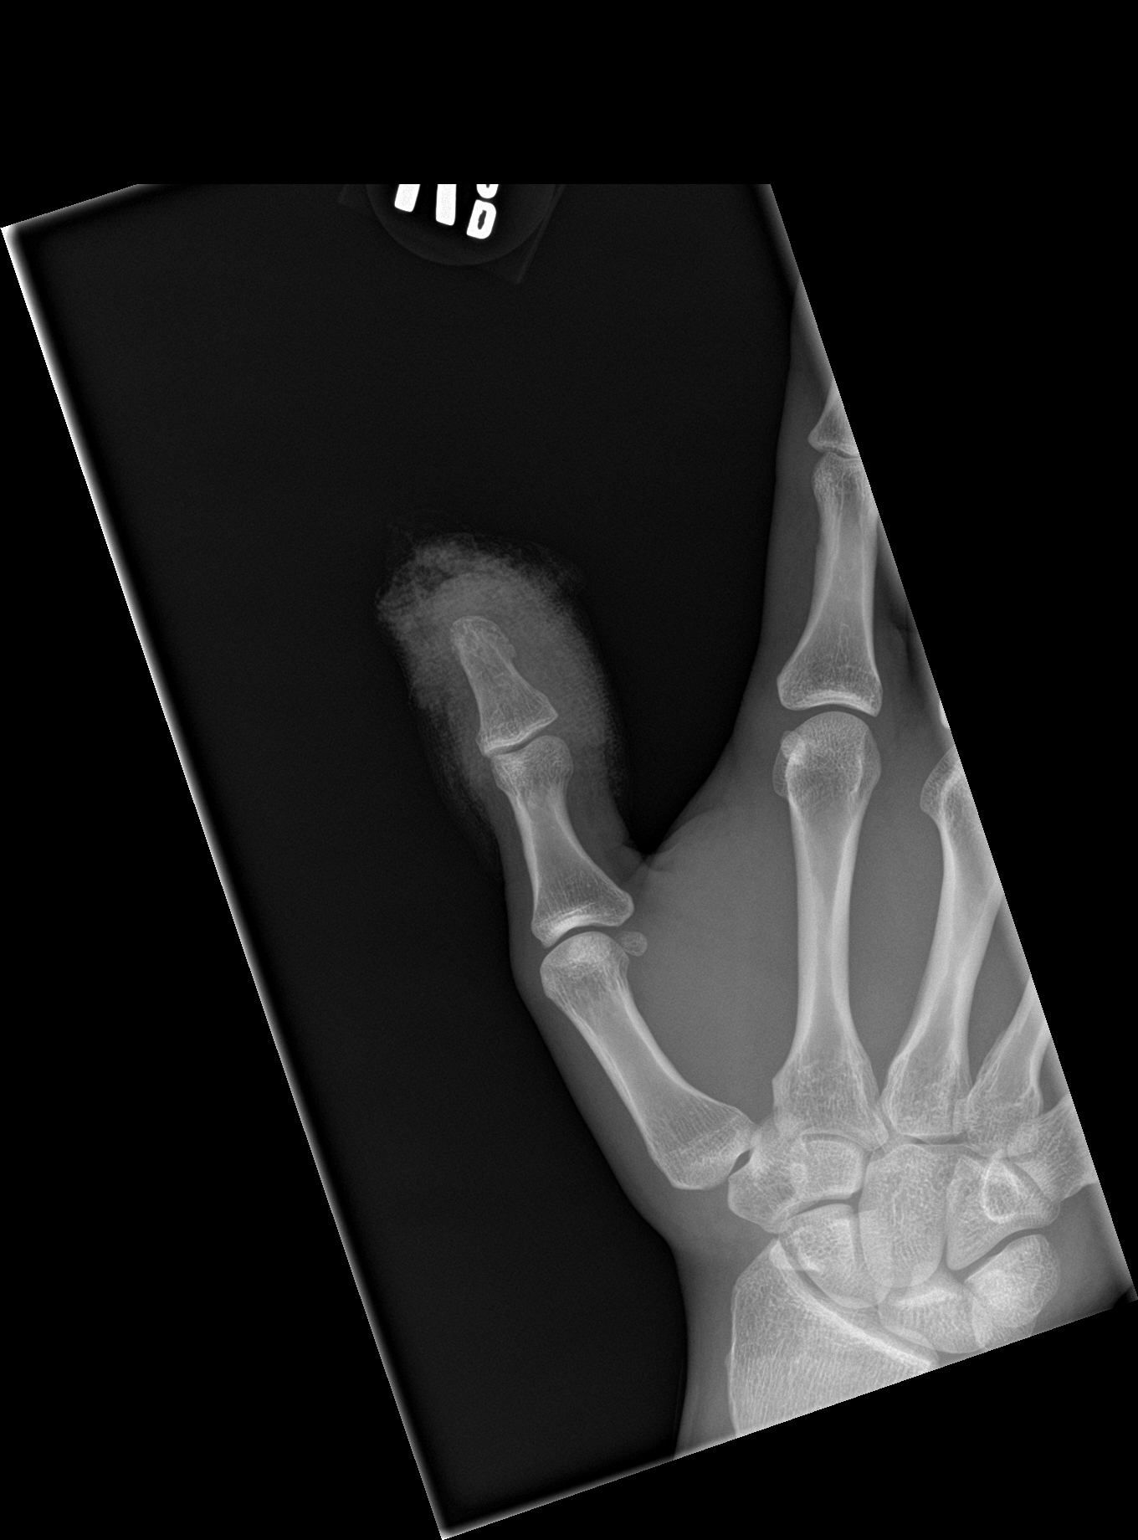

[finger lat]
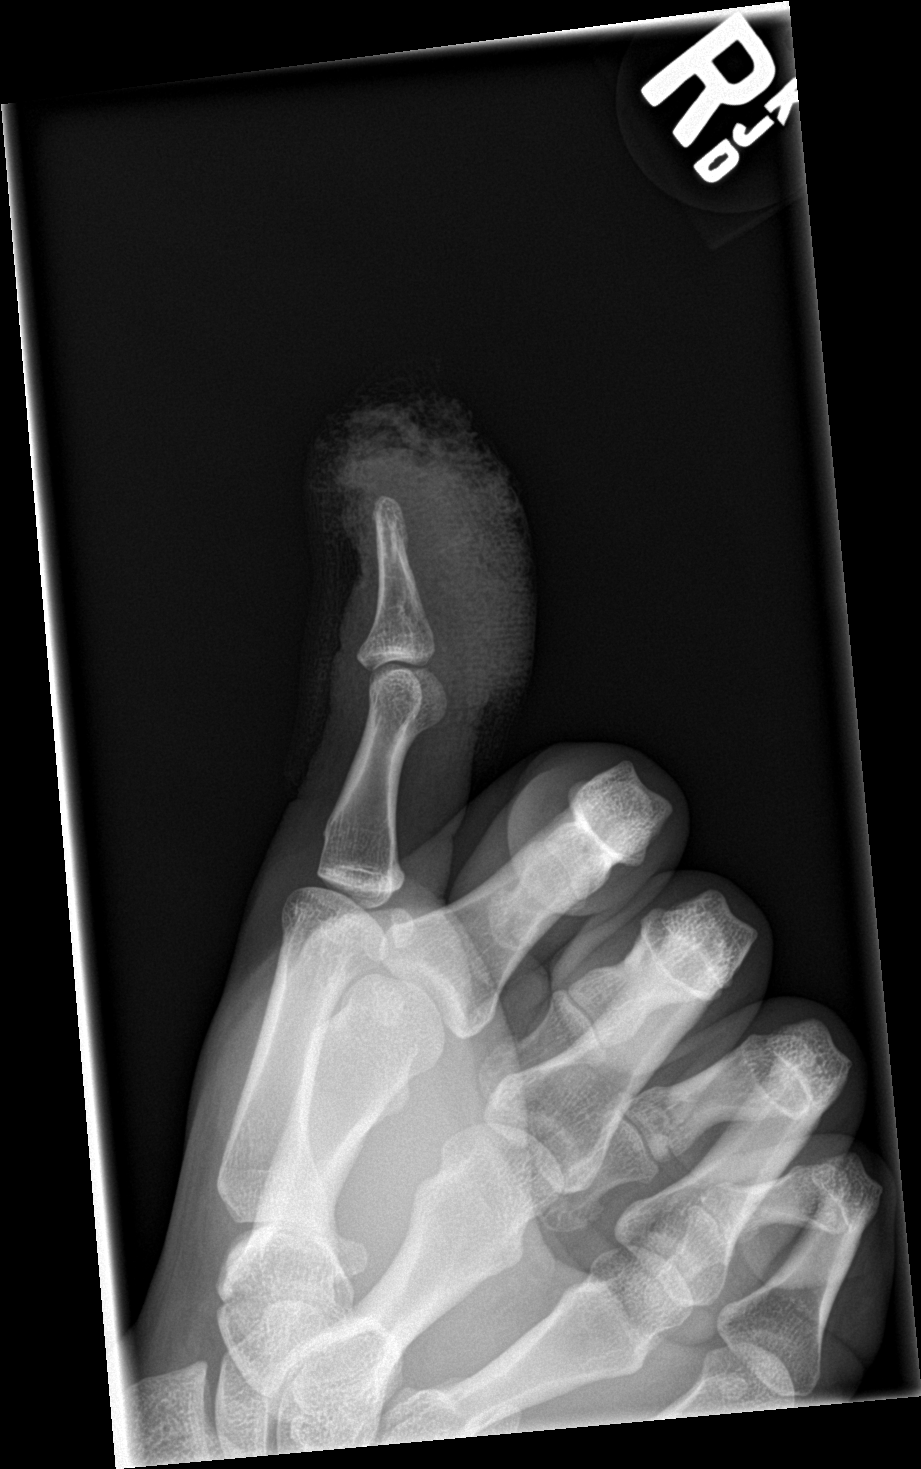

[3 of 3 positions shown; findings below may reference images not displayed]

FINDINGS: Bandage overlies the first digit without subcutaneous gas.
Laceration posterior tuft soft tissues. No fracture deformity. No
dislocation. No destructive bony lesions.
IMPRESSION: First digit tuft soft tissue laceration without acute osseous
process.

## 2019-10-13 ENCOUNTER — Ambulatory Visit (HOSPITAL_COMMUNITY)
Admission: EM | Admit: 2019-10-13 | Discharge: 2019-10-13 | Disposition: A | Discharge status: Home / Self Care | Payer: 59 | Attending: Internal Medicine | Admitting: Internal Medicine

## 2019-10-13 ENCOUNTER — Encounter (HOSPITAL_COMMUNITY): Payer: Self-pay

## 2019-10-13 ENCOUNTER — Other Ambulatory Visit: Payer: Self-pay

## 2019-10-13 ENCOUNTER — Ambulatory Visit (INDEPENDENT_AMBULATORY_CARE_PROVIDER_SITE_OTHER): Payer: 59

## 2019-10-13 DIAGNOSIS — S62336A Displaced fracture of neck of fifth metacarpal bone, right hand, initial encounter for closed fracture: Secondary | ICD-10-CM | POA: Diagnosis not present

## 2019-10-13 DIAGNOSIS — S62366A Nondisplaced fracture of neck of fifth metacarpal bone, right hand, initial encounter for closed fracture: Secondary | ICD-10-CM | POA: Diagnosis not present

## 2019-10-13 NOTE — ED Provider Notes (Signed)
[place]    CSN: [id number] Arrival date & time: 10/13/19  3009      History   Chief Complaint Chief Complaint  Patient presents with  . Hand Injury    HPI Caleb Herrera is a 38 y.o. male.   Caleb Herrera presents with complaints of pain to 5th metacarpal of right hand after a slip and fall. It was raining and he slipped on a step, landing on right lateral hand. Swelling a few hours following the incident. Point specific swelling and pain has persisted. No pain at rest. Pain only if pinky is moved laterally or if the affected area is touched. He does have pain with flexion at the MCP joint as well. He is right handed. Initially when more swelling was present he did have some change in sensation to the pinky, but states now that swelling has improved sensation has improved. Hasn't been taking any medications regularly for pain. Denies any previous injury to the hand.    ROS per HPI, negative if not otherwise mentioned.      History reviewed. No pertinent past medical history.  Patient Active Problem List   Diagnosis Date Noted  . Jaw clicking 23/30/0762  . TMJ (temporomandibular joint syndrome) 07/01/2015  . Seasonal allergies 07/01/2015    Past Surgical History:  Procedure Laterality Date  . WISDOM TOOTH EXTRACTION         Home Medications    Prior to Admission medications   Medication Sig Start Date End Date Taking? Authorizing Provider  fluticasone (FLONASE) 50 MCG/ACT nasal spray Place 2 sprays into both nostrils daily. Patient not taking: Reported on 07/15/2015 07/01/15   Jaynee Eagles, PA-C  levocetirizine (XYZAL) 5 MG tablet TAKE 1 TABLET BY MOUTH EVERY EVENING. Patient not taking: Reported on 06/15/2017 07/13/16   Jaynee Eagles, PA-C  naproxen sodium (ANAPROX) 550 MG tablet Take 1 tablet (550 mg total) by mouth 2 (two) times daily with a meal. Patient not taking: Reported on 06/15/2017 07/01/15   Jaynee Eagles, PA-C    Family History History  reviewed. No pertinent family history.  Social History Social History   Tobacco Use  . Smoking status: Current Every Day Smoker  . Smokeless tobacco: Never Used  Substance Use Topics  . Alcohol use: Yes    Alcohol/week: 0.0 standard drinks  . Drug use: No     Allergies   Patient has no known allergies.   Review of Systems Review of Systems   Physical Exam Triage Vital Signs ED Triage Vitals  Enc Vitals Group     BP 10/13/19 0846 (!) 142/90     Pulse Rate 10/13/19 0846 82     Resp 10/13/19 0846 18     Temp 10/13/19 0846 98.6 F (37 C)     Temp Source 10/13/19 0846 Oral     SpO2 10/13/19 0846 96 %     Weight --      Height --      Head Circumference --      Peak Flow --      Pain Score 10/13/19 0849 6     Pain Loc --      Pain Edu? --      Excl. in [place]? --    No data found.  Updated Vital Signs BP (!) 142/90 (BP Location: Left Arm)   Pulse 82   Temp 98.6 F (37 C) (Oral)   Resp 18   SpO2 96%    Physical Exam Constitutional:  Appearance: He is well-developed.  Cardiovascular:     Rate and Rhythm: Normal rate.  Pulmonary:     Effort: Pulmonary effort is normal.  Musculoskeletal:     Right hand: He exhibits decreased range of motion, tenderness, bony tenderness and swelling. He exhibits normal capillary refill and no laceration. Normal sensation noted. Decreased strength noted. He exhibits finger abduction.       Hands:     Comments: Point tenderness and mild swelling noted to distal 5th metacarpal  Skin:    General: Skin is warm and dry.  Neurological:     Mental Status: He is alert and oriented to person, place, and time.      UC Treatments / Results  Labs (all labs ordered are listed, but only abnormal results are displayed) Labs Reviewed - No data to display  EKG   Radiology Dg Hand Complete Right  Result Date: 10/13/2019 CLINICAL DATA:  Fall with right hand injury EXAM: RIGHT HAND - COMPLETE 3+ VIEW COMPARISON:  None FINDINGS:  Oblique fifth metacarpal neck fracture with palmar sided impaction. No dislocation. IMPRESSION: Mildly impacted fifth metacarpal neck fracture. Electronically Signed   By: Marnee Spring M.D.   On: 10/13/2019 09:04    Procedures Procedures (including critical care time)  Medications Ordered in UC Medications - No data to display  Initial Impression / Assessment and Plan / UC Course  I have reviewed the triage vital signs and the nursing notes.  Pertinent labs & imaging results that were available during my care of the patient were reviewed by me and considered in my medical decision making (see chart for details).     5th metacarpal neck fracture on xray, consistent with history and physical. Splint placed and follow up with orthopedics/hand discussed. Patient verbalized understanding and agreeable to plan.   Final Clinical Impressions(s) / UC Diagnoses   Final diagnoses:  Closed nondisplaced fracture of neck of fifth metacarpal bone of right hand, initial encounter     Discharge Instructions     You have a fracture to your 5th metacarpal.  Keep splint in place until seen by orthopedics.  Call orthopedics today for further evaluation.  Elevation, ice, ibuprofen as needed for pain.    CLINICAL DATA:  Fall with right hand injury   EXAM: RIGHT HAND - COMPLETE 3+ VIEW   COMPARISON:  None   FINDINGS: Oblique fifth metacarpal neck fracture with palmar sided impaction. No dislocation.   IMPRESSION: Mildly impacted fifth metacarpal neck fracture.     Electronically Signed   By: Marnee Spring M.D.    ED Prescriptions    None     PDMP not reviewed this encounter.   Georgetta Haber, NP 10/13/19 2023

## 2019-10-13 NOTE — Discharge Instructions (Signed)
You have a fracture to your 5th metacarpal.  Keep splint in place until seen by orthopedics.  Call orthopedics today for further evaluation.  Elevation, ice, ibuprofen as needed for pain.    CLINICAL DATA:  Fall with right hand injury   EXAM: RIGHT HAND - COMPLETE 3+ VIEW   COMPARISON:  None   FINDINGS: Oblique fifth metacarpal neck fracture with palmar sided impaction. No dislocation.   IMPRESSION: Mildly impacted fifth metacarpal neck fracture.     Electronically Signed   By: Neva Seat.D.

## 2019-10-13 NOTE — ED Notes (Signed)
Ortho aware of need for ulnar gutter splint.

## 2019-10-13 NOTE — Progress Notes (Signed)
Orthopedic Tech Progress Note Patient Details:  Caleb Herrera 10/08/1981 [id number]  Ortho Devices Type of Ortho Device: Ulna gutter splint Ortho Device/Splint Location: RUE Ortho Device/Splint Interventions: Application, Ordered   Post Interventions Patient Tolerated: Well Instructions Provided: Care of device, Adjustment of device   Janit Pagan 10/13/2019, 9:32 AM

## 2019-10-13 NOTE — ED Triage Notes (Signed)
Pt presents with right hand injury (pinky) after a fall on some steps on Wednesday.

## 2019-10-15 DIAGNOSIS — M79641 Pain in right hand: Secondary | ICD-10-CM | POA: Diagnosis not present

## 2019-10-15 DIAGNOSIS — S62336A Displaced fracture of neck of fifth metacarpal bone, right hand, initial encounter for closed fracture: Secondary | ICD-10-CM | POA: Diagnosis not present

## 2019-10-16 DIAGNOSIS — S62336A Displaced fracture of neck of fifth metacarpal bone, right hand, initial encounter for closed fracture: Secondary | ICD-10-CM | POA: Diagnosis not present

## 2019-10-16 MED FILL — HYDROCODON-APAP 5-325: 5-325 | 5 days supply | Qty: 35 | Fill #0

## 2019-10-29 DIAGNOSIS — S62336A Displaced fracture of neck of fifth metacarpal bone, right hand, initial encounter for closed fracture: Secondary | ICD-10-CM | POA: Diagnosis not present

## 2019-10-29 DIAGNOSIS — M79641 Pain in right hand: Secondary | ICD-10-CM | POA: Diagnosis not present

## 2019-10-29 DIAGNOSIS — M25641 Stiffness of right hand, not elsewhere classified: Secondary | ICD-10-CM | POA: Diagnosis not present

## 2019-10-29 DIAGNOSIS — Z4789 Encounter for other orthopedic aftercare: Secondary | ICD-10-CM | POA: Diagnosis not present

## 2019-11-27 DIAGNOSIS — M25641 Stiffness of right hand, not elsewhere classified: Secondary | ICD-10-CM | POA: Diagnosis not present

## 2019-12-10 DIAGNOSIS — Z4789 Encounter for other orthopedic aftercare: Secondary | ICD-10-CM | POA: Diagnosis not present

## 2019-12-10 DIAGNOSIS — S62336A Displaced fracture of neck of fifth metacarpal bone, right hand, initial encounter for closed fracture: Secondary | ICD-10-CM | POA: Diagnosis not present

## 2020-01-21 DIAGNOSIS — S62336A Displaced fracture of neck of fifth metacarpal bone, right hand, initial encounter for closed fracture: Secondary | ICD-10-CM | POA: Diagnosis not present

## 2021-12-04 ENCOUNTER — Emergency Department (HOSPITAL_BASED_OUTPATIENT_CLINIC_OR_DEPARTMENT_OTHER)
Admission: EM | Admit: 2021-12-04 | Discharge: 2021-12-04 | Disposition: A | Discharge status: Home / Self Care | Payer: 59 | Attending: Emergency Medicine | Admitting: Emergency Medicine

## 2021-12-04 ENCOUNTER — Other Ambulatory Visit: Payer: Self-pay

## 2021-12-04 ENCOUNTER — Emergency Department (HOSPITAL_BASED_OUTPATIENT_CLINIC_OR_DEPARTMENT_OTHER): Payer: 59

## 2021-12-04 ENCOUNTER — Encounter (HOSPITAL_BASED_OUTPATIENT_CLINIC_OR_DEPARTMENT_OTHER): Payer: Self-pay | Admitting: Obstetrics and Gynecology

## 2021-12-04 DIAGNOSIS — R1084 Generalized abdominal pain: Secondary | ICD-10-CM | POA: Diagnosis not present

## 2021-12-04 DIAGNOSIS — R109 Unspecified abdominal pain: Secondary | ICD-10-CM | POA: Diagnosis not present

## 2021-12-04 DIAGNOSIS — R110 Nausea: Secondary | ICD-10-CM | POA: Insufficient documentation

## 2021-12-04 DIAGNOSIS — R1010 Upper abdominal pain, unspecified: Secondary | ICD-10-CM | POA: Diagnosis present

## 2021-12-04 LAB — URINALYSIS, ROUTINE W REFLEX MICROSCOPIC
Bilirubin Urine: NEGATIVE
Glucose, UA: NEGATIVE mg/dL
Hgb urine dipstick: NEGATIVE
Ketones, ur: 15 mg/dL — AB
Leukocytes,Ua: NEGATIVE
Nitrite: NEGATIVE
Protein, ur: 30 mg/dL — AB
Specific Gravity, Urine: 1.037 — ABNORMAL HIGH (ref 1.005–1.030)
pH: 5.5 (ref 5.0–8.0)

## 2021-12-04 LAB — COMPREHENSIVE METABOLIC PANEL
ALT: 9 U/L (ref 0–44)
AST: 13 U/L — ABNORMAL LOW (ref 15–41)
Albumin: 4.8 g/dL (ref 3.5–5.0)
Alkaline Phosphatase: 30 U/L — ABNORMAL LOW (ref 38–126)
Anion gap: 10 (ref 5–15)
BUN: 14 mg/dL (ref 6–20)
CO2: 26 mmol/L (ref 22–32)
Calcium: 9.7 mg/dL (ref 8.9–10.3)
Chloride: 101 mmol/L (ref 98–111)
Creatinine, Ser: 0.95 mg/dL (ref 0.61–1.24)
GFR, Estimated: >60 mL/min (ref >60)
Glucose, Bld: 83 mg/dL (ref 70–99)
Potassium: 3.8 mmol/L (ref 3.5–5.1)
Sodium: 137 mmol/L (ref 135–145)
Total Bilirubin: 0.6 mg/dL (ref 0.3–1.2)
Total Protein: 7.5 g/dL (ref 6.5–8.1)

## 2021-12-04 LAB — CBC
HCT: 44.2 % (ref 39.0–52.0)
Hemoglobin: 14.3 g/dL (ref 13.0–17.0)
MCH: 25.9 pg — ABNORMAL LOW (ref 26.0–34.0)
MCHC: 32.4 g/dL (ref 30.0–36.0)
MCV: 79.9 fL — ABNORMAL LOW (ref 80.0–100.0)
Platelets: 362 10*3/uL (ref 150–400)
RBC: 5.53 MIL/uL (ref 4.22–5.81)
RDW: 13.2 % (ref 11.5–15.5)
WBC: 6.6 10*3/uL (ref 4.0–10.5)
nRBC: 0 % (ref 0.0–0.2)

## 2021-12-04 LAB — LIPASE, BLOOD: Lipase: 15 U/L (ref 11–51)

## 2021-12-04 MED ORDER — IOHEXOL 300 MG/ML  SOLN
100.0000 mL | Freq: Once | INTRAMUSCULAR | Status: AC | PRN
  Administered 2021-12-04: 100 mL via INTRAVENOUS

## 2021-12-04 MED ORDER — ONDANSETRON HCL 8 MG PO TABS
8.0000 mg | ORAL_TABLET | Freq: Three times a day (TID) | ORAL | 0 refills | Status: AC | PRN
  Filled 2021-12-04: qty 20, 7d supply, fill #0

## 2021-12-04 MED ORDER — PANTOPRAZOLE SODIUM 20 MG PO TBEC
20.0000 mg | DELAYED_RELEASE_TABLET | Freq: Every day | ORAL | 0 refills | Status: AC
  Filled 2021-12-04: qty 30, 30d supply, fill #0

## 2021-12-04 NOTE — Discharge Instructions (Signed)
No definite cause of your abdominal pain was found here in the emergency department. A CT scan was obtained and shows no evidence of acute abnormality. Labs were essentially normal. Please use Zofran as needed and drink plenty of fluids. Recheck with your doctor if not improved in 24 to 48 hours Please return if you are having worsening symptoms anytime.

## 2021-12-04 NOTE — ED Provider Notes (Signed)
[facility] EMERGENCY DEPT Provider Note   CSN: [id number] Arrival date & time: 12/04/21  1828     History  Chief Complaint  Patient presents with   Abdominal Pain    Caleb Herrera is a 41 y.o. male.  HPI 41 year old male presents today complaining of upper abdominal pain.  He states that it feels like there is a washing machine in his stomach.  He states that this feels like it is constantly turning.  He has had some nausea associated with this.  The symptoms have been present for 2 weeks.  He has not had fever but has had some chills.  He has had some diffuse body aches.  He states that his significant other had some GI symptoms but those resolved.  He reports that he has some decreased appetite but also states that the pain has improved somewhat when he has eaten.  He states that he has had constipation.  He took a bowel stool softener yesterday and had a normal bowel movement.  Prior to that he had not had a bowel movement for 2 to 3 days.  He denies any urinary tract infection symptoms.  He has not had any prior surgeries.  He denies any significant alcohol intake.  Was not preceded by alcohol.    Home Medications Prior to Admission medications   Medication Sig Start Date End Date Taking? Authorizing Provider  ondansetron (ZOFRAN) 8 MG tablet Take 1 tablet (8 mg total) by mouth every 8 (eight) hours as needed for nausea or vomiting. 12/04/21  Yes Pattricia Boss, MD  pantoprazole (PROTONIX) 20 MG tablet Take 1 tablet (20 mg total) by mouth daily. 12/04/21  Yes Pattricia Boss, MD  fluticasone (FLONASE) 50 MCG/ACT nasal spray Place 2 sprays into both nostrils daily. Patient not taking: Reported on 07/15/2015 07/01/15   Jaynee Eagles, PA-C  levocetirizine (XYZAL) 5 MG tablet TAKE 1 TABLET BY MOUTH EVERY EVENING. Patient not taking: Reported on 06/15/2017 07/13/16   Jaynee Eagles, PA-C  naproxen sodium (ANAPROX) 550 MG tablet Take 1 tablet (550 mg total) by mouth 2 (two) times daily  with a meal. Patient not taking: Reported on 06/15/2017 07/01/15   Jaynee Eagles, PA-C      Allergies    Patient has no known allergies.    Review of Systems   Review of Systems  All other systems reviewed and are negative.  Physical Exam Updated Vital Signs BP 130/71    Pulse 84    Temp 98.8 F (37.1 C)    Resp (!) 25    Ht 1.702 m (_0 )    Wt 72.6 kg    SpO2 100%    BMI 25.06 kg/m  Physical Exam Vitals and nursing note reviewed.  Constitutional:      Appearance: He is well-developed.  HENT:     Head: Normocephalic and atraumatic.     Right Ear: External ear normal.     Left Ear: External ear normal.     Nose: Nose normal.  Eyes:     Extraocular Movements: Extraocular movements intact.  Neck:     Trachea: No tracheal deviation.  Pulmonary:     Effort: Pulmonary effort is normal.  Abdominal:     General: Bowel sounds are normal.     Palpations: Abdomen is soft.     Tenderness: There is abdominal tenderness in the right lower quadrant and epigastric area.  Musculoskeletal:        General: Normal range of motion.  Skin:    General: Skin is warm and dry.     Capillary Refill: Capillary refill takes less than 2 seconds.  Neurological:     Mental Status: He is alert and oriented to person, place, and time.  Psychiatric:        Mood and Affect: Mood normal.        Behavior: Behavior normal.    ED Results / Procedures / Treatments   Labs (all labs ordered are listed, but only abnormal results are displayed) Labs Reviewed  COMPREHENSIVE METABOLIC PANEL - Abnormal; Notable for the following components:      Result Value   AST 13 (*)    Alkaline Phosphatase 30 (*)    All other components within normal limits  CBC - Abnormal; Notable for the following components:   MCV 79.9 (*)    MCH 25.9 (*)    All other components within normal limits  URINALYSIS, ROUTINE W REFLEX MICROSCOPIC - Abnormal; Notable for the following components:   Specific Gravity, Urine 1.037 (*)     Ketones, ur 15 (*)    Protein, ur 30 (*)    Bacteria, UA RARE (*)    All other components within normal limits  LIPASE, BLOOD    EKG None  Radiology CT ABDOMEN PELVIS W CONTRAST  Result Date: 12/04/2021 CLINICAL DATA:  Acute abdominal pain EXAM: CT ABDOMEN AND PELVIS WITH CONTRAST TECHNIQUE: Multidetector CT imaging of the abdomen and pelvis was performed using the standard protocol following bolus administration of intravenous contrast. CONTRAST:  164m OMNIPAQUE IOHEXOL 300 MG/ML  SOLN COMPARISON:  None. FINDINGS: LOWER CHEST: Normal. HEPATOBILIARY: Normal hepatic contours. No intra- or extrahepatic biliary dilatation. The gallbladder is normal. PANCREAS: Normal pancreas. No ductal dilatation or peripancreatic fluid collection. SPLEEN: Normal. ADRENALS/URINARY TRACT: The adrenal glands are normal. No hydronephrosis, nephroureterolithiasis or solid renal mass. The urinary bladder is normal for degree of distention STOMACH/BOWEL: There is no hiatal hernia. Normal duodenal course and caliber. No small bowel dilatation or inflammation. No focal colonic abnormality. The appendix is not visualized. No right lower quadrant inflammation or free fluid. VASCULAR/LYMPHATIC: Normal course and caliber of the major abdominal vessels. No abdominal or pelvic lymphadenopathy. REPRODUCTIVE: Normal prostate size with symmetric seminal vesicles. MUSCULOSKELETAL. No bony spinal canal stenosis or focal osseous abnormality. OTHER: None. IMPRESSION: No acute abnormality of the abdomen or pelvis. Electronically Signed   By: KUlyses JarredM.D.   On: 12/04/2021 20:33    Procedures Procedures    Medications Ordered in ED Medications  iohexol (OMNIPAQUE) 300 MG/ML solution 100 mL (100 mLs Intravenous Contrast Given 12/04/21 2006)    ED Course/ Medical Decision Making/ A&P Clinical Course as of 12/04/21 2054  Sun Dec 04, 2021  2050 Urine reviewed and significant only for rare bacteria with 0-5 squamous epithelial cells  so appears to likely be some contaminant Patient does not have urinary symptoms CBC reviewed and within normal limits C-Met reviewed and within normal limits [DR]  2051 CT personally reviewed and interpreted no gross abnormalities noted radiology interpretation CT abdomen reviewed no acute abnormalities noted [DR]    Clinical Course User Index [DR] RPattricia Boss MD                           Medical Decision Making Amount and/or Complexity of Data Reviewed Labs: ordered. Decision-making details documented in ED Course. Radiology: ordered and independent interpretation performed. Decision-making details documented in ED Course.  41year old male presents  today complaining of diffuse abdominal discomfort.  Mild tenderness to palpation.  No definitive etiology of pain noted here ED including on labs, urine, and CT.  Plan Zofran and Protonix.  Encouraging p.o. liquid intake.  Patient has complained of but no increased stool burden noted.  Patient advised of return precautions and need for close follow-up and voices understanding.        Final Clinical Impression(s) / ED Diagnoses Final diagnoses:  Generalized abdominal pain    Rx / DC Orders ED Discharge Orders          Ordered    ondansetron (ZOFRAN) 8 MG tablet  Every 8 hours PRN        12/04/21 2053    pantoprazole (PROTONIX) 20 MG tablet  Daily        12/04/21 2054              Pattricia Boss, MD 12/04/21 2054

## 2021-12-04 NOTE — ED Triage Notes (Signed)
Patient reports stomach pain that started around/after christmas and has continued to have pain and has had constipation. Patient reports he took a stool softener yesterday and was able to have a BM today.

## 2021-12-04 NOTE — ED Notes (Signed)
ED Provider at bedside. 

## 2021-12-05 ENCOUNTER — Other Ambulatory Visit (HOSPITAL_COMMUNITY): Payer: Self-pay

## 2024-08-17 ENCOUNTER — Emergency Department (HOSPITAL_COMMUNITY)
Admission: EM | Admit: 2024-08-17 | Discharge: 2024-08-17 | Disposition: A | Discharge status: Home / Self Care | Payer: Self-pay | Attending: Emergency Medicine | Admitting: Emergency Medicine

## 2024-08-17 ENCOUNTER — Other Ambulatory Visit: Payer: Self-pay

## 2024-08-17 DIAGNOSIS — Y92410 Unspecified street and highway as the place of occurrence of the external cause: Secondary | ICD-10-CM | POA: Diagnosis not present

## 2024-08-17 DIAGNOSIS — M25522 Pain in left elbow: Secondary | ICD-10-CM | POA: Insufficient documentation

## 2024-08-17 DIAGNOSIS — M7918 Myalgia, other site: Secondary | ICD-10-CM

## 2024-08-17 MED ORDER — IBUPROFEN 600 MG PO TABS: 600.0000 mg | ORAL_TABLET | Freq: Four times a day (QID) | ORAL | 0 refills | Status: AC | PRN

## 2024-08-17 MED ORDER — METHOCARBAMOL 500 MG PO TABS: 1000.0000 mg | ORAL_TABLET | Freq: Three times a day (TID) | ORAL | 0 refills | Status: AC
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
## 2024-08-17 NOTE — Discharge Instructions (Signed)
 Take the medications as prescribed. Rest over the next 2-3 days. Follow up with your doctor if pain persists and return to the ED with any new or worsening pain.
